# Patient Record
Sex: Female | Born: 1960 | Race: Black or African American | Hispanic: No | Marital: Single | Smoking: Never smoker
Health system: Southern US, Community
[De-identification: ages and names within clinical notes are randomized; demographics above are authoritative.]

## PROBLEM LIST (undated history)

## (undated) DIAGNOSIS — D649 Anemia, unspecified: Secondary | ICD-10-CM

## (undated) DIAGNOSIS — J45909 Unspecified asthma, uncomplicated: Secondary | ICD-10-CM

## (undated) HISTORY — PX: FRACTURE SURGERY: SHX138

## (undated) HISTORY — PX: TUBAL LIGATION: SHX77

---

## 2017-02-14 ENCOUNTER — Encounter (HOSPITAL_COMMUNITY): Payer: Self-pay | Admitting: Emergency Medicine

## 2017-02-14 ENCOUNTER — Ambulatory Visit (HOSPITAL_COMMUNITY)
Admission: EM | Admit: 2017-02-14 | Discharge: 2017-02-14 | Disposition: A | Discharge status: Home / Self Care | Payer: BLUE CROSS/BLUE SHIELD | Attending: Family Medicine | Admitting: Family Medicine

## 2017-02-14 DIAGNOSIS — R200 Anesthesia of skin: Secondary | ICD-10-CM

## 2017-02-14 DIAGNOSIS — R202 Paresthesia of skin: Secondary | ICD-10-CM

## 2017-02-14 MED ORDER — PREDNISONE 20 MG PO TABS: 40.0000 mg | ORAL_TABLET | Freq: Every day | ORAL | 0 refills | Status: AC

## 2017-02-14 MED ORDER — DICLOFENAC SODIUM 1 % TD GEL: 2.0000 g | Freq: Four times a day (QID) | TRANSDERMAL | 0 refills | Status: DC

## 2017-02-14 NOTE — ED Triage Notes (Signed)
PT reports right arm numbness and tingling for 3 weeks. PT is not diabetic. PT reports no injury.

## 2017-02-14 NOTE — ED Provider Notes (Signed)
[facility]    CSN: [id number] Arrival date & time: 02/14/17  1718     History   Chief Complaint Chief Complaint  Patient presents with  . Numbness    HPI Briana Rogers is a 56 y.o. female.   56 year old healthy female comes in for 2 week history of intermittent numbness/tingling of the hand. Symptoms worse at night with some pain at the wrist. Has tried some topical icy/hot with improvement, but wears off in 1-2 hours. Patient's work requires repetitive motion of the wrist, has not noticed the movement causing worsening of symptoms. Denies injury/trauma. Patient denies history of DM.       History reviewed. No pertinent past medical history.  There are no active problems to display for this patient.   Past Surgical History:  Procedure Laterality Date  . FRACTURE SURGERY    . TUBAL LIGATION      OB History    No data available       Home Medications    Prior to Admission medications   Medication Sig Start Date End Date Taking? Authorizing Provider  diclofenac sodium (VOLTAREN) 1 % GEL Apply 2 g topically 4 (four) times daily. 02/14/17   Cathie Hoops, Amy V, PA-C  predniSONE (DELTASONE) 20 MG tablet Take 2 tablets (40 mg total) by mouth daily. 02/14/17 02/19/17  Belinda Fisher, PA-C    Family History No family history on file.  Social History Social History  Substance Use Topics  . Smoking status: Never Smoker  . Smokeless tobacco: Never Used  . Alcohol use Not on file     Allergies   Patient has no known allergies.   Review of Systems Review of Systems  Reason unable to perform ROS: See HPI as above.     Physical Exam Triage Vital Signs ED Triage Vitals [02/14/17 1758]  Enc Vitals Group     BP 135/79     Pulse Rate 60     Resp 16     Temp 98.1 F (36.7 C)     Temp Source Oral     SpO2 100 %     Weight 157 lb (71.2 kg)     Height 5' (1.524 m)     Head Circumference      Peak Flow      Pain Score 0     Pain Loc      Pain Edu?    Excl. in GC?    No data found.   Updated Vital Signs BP 135/79   Pulse 60   Temp 98.1 F (36.7 C) (Oral)   Resp 16   Ht 5' (1.524 m)   Wt 157 lb (71.2 kg)   SpO2 100%   BMI 30.66 kg/m   Physical Exam  Constitutional: She is oriented to person, place, and time. She appears well-developed and well-nourished. No distress.  HENT:  Head: Normocephalic and atraumatic.  Eyes: Pupils are equal, round, and reactive to light. Conjunctivae are normal.  Cardiovascular: Normal rate, regular rhythm and normal heart sounds.  Exam reveals no gallop and no friction rub.   No murmur heard. Pulmonary/Chest: Effort normal and breath sounds normal. She has no wheezes. She has no rales.  Musculoskeletal:  No tenderness on palpation of the elbow/arm/wrist/fingers. Full range of motion of elbow, wrist, fingers. Strength normal and equal bilaterally. Sensation decreased  2nd-4th fingers. Radial pulses 2+ and equal bilaterally. Capillary refill unable to assess due to nail polish.  Negative tinels. Positive phalen  Neurological: She is alert and oriented to person, place, and time.  Skin: Skin is warm and dry.     UC Treatments / Results  Labs (all labs ordered are listed, but only abnormal results are displayed) Labs Reviewed - No data to display  EKG  EKG Interpretation None       Radiology No results found.  Procedures Procedures (including critical care time)  Medications Ordered in UC Medications - No data to display   Initial Impression / Assessment and Plan / UC Course  I have reviewed the triage vital signs and the nursing notes.  Pertinent labs & imaging results that were available during my care of the patient were reviewed by me and considered in my medical decision making (see chart for details).    Possible carpel tunnel causing symptoms. Given patient with stomach upset with oral NSAIDs. Voltaren gel and prednisone as directed. Wrist splint during activity and at  night. Follow up with orthopedics if symptoms not improving. Return precautions given.   Final Clinical Impressions(s) / UC Diagnoses   Final diagnoses:  Numbness and tingling in right hand    New Prescriptions Discharge Medication List as of 02/14/2017  6:38 PM    START taking these medications   Details  diclofenac sodium (VOLTAREN) 1 % GEL Apply 2 g topically 4 (four) times daily., Starting Wed 02/14/2017, Normal    predniSONE (DELTASONE) 20 MG tablet Take 2 tablets (40 mg total) by mouth daily., Starting Wed 02/14/2017, Until Mon 02/19/2017, Normal          Linward HeadlandYu, Amy V, [place]PA-C 02/14/17 2053

## 2017-02-14 NOTE — Discharge Instructions (Signed)
Possible carpel tunnel syndrome given history and exam. You can take tylenol for pain. Voltaren gel for pain and inflammation. Prednisone as directed for inflammation. Ice compress and elevation. Wrist splint during activity and at night. Follow up with orthopedics if symptoms do not improve for further evaluation.

## 2017-12-14 ENCOUNTER — Ambulatory Visit (HOSPITAL_COMMUNITY)
Admission: EM | Admit: 2017-12-14 | Discharge: 2017-12-14 | Disposition: A | Discharge status: Home / Self Care | Payer: BLUE CROSS/BLUE SHIELD | Attending: Family Medicine | Admitting: Family Medicine

## 2017-12-14 ENCOUNTER — Encounter (HOSPITAL_COMMUNITY): Payer: Self-pay

## 2017-12-14 ENCOUNTER — Ambulatory Visit (INDEPENDENT_AMBULATORY_CARE_PROVIDER_SITE_OTHER): Payer: BLUE CROSS/BLUE SHIELD

## 2017-12-14 DIAGNOSIS — J189 Pneumonia, unspecified organism: Secondary | ICD-10-CM | POA: Diagnosis not present

## 2017-12-14 DIAGNOSIS — R0600 Dyspnea, unspecified: Secondary | ICD-10-CM | POA: Diagnosis not present

## 2017-12-14 DIAGNOSIS — R05 Cough: Secondary | ICD-10-CM | POA: Diagnosis not present

## 2017-12-14 MED ORDER — PREDNISONE 10 MG (21) PO TBPK: ORAL_TABLET | ORAL | 0 refills | Status: DC

## 2017-12-14 MED ORDER — IPRATROPIUM-ALBUTEROL 0.5-2.5 (3) MG/3ML IN SOLN
3.0000 mL | Freq: Once | RESPIRATORY_TRACT | Status: AC
  Administered 2017-12-14: 3 mL via RESPIRATORY_TRACT

## 2017-12-14 MED ORDER — AZITHROMYCIN 250 MG PO TABS: ORAL_TABLET | ORAL | 0 refills | Status: DC

## 2017-12-14 MED ORDER — NYSTATIN 100000 UNIT/GM EX CREA: TOPICAL_CREAM | CUTANEOUS | 1 refills | Status: DC

## 2017-12-14 MED ORDER — ALBUTEROL SULFATE HFA 108 (90 BASE) MCG/ACT IN AERS: 1.0000 | INHALATION_SPRAY | Freq: Four times a day (QID) | RESPIRATORY_TRACT | 0 refills | Status: DC | PRN

## 2017-12-14 MED ORDER — IPRATROPIUM-ALBUTEROL 0.5-2.5 (3) MG/3ML IN SOLN
RESPIRATORY_TRACT | Status: AC
  Filled 2017-12-14: qty 3

## 2017-12-14 NOTE — ED Triage Notes (Signed)
Pt presents  with an asthma flare up and a rash under breast

## 2017-12-14 NOTE — ED Provider Notes (Signed)
[facility]    CSN: [id number] Arrival date & time: 12/14/17  [id number]     History   Chief Complaint Chief Complaint  Patient presents with  . Asthma  . Rash    Under Breast     HPI Briana Rogers is a 57 y.o. female.   Patient is a 57 year old female with past medical history of asthma.  She presents with 1 month of cough and congestion.  The symptoms have been waxing and waning.  She has been taking multiple different over-the-counter medications to include Mucinex, TheraFlu.  Her symptoms will get better but then return.  He denies any fever, chills, body aches, fatigue.  She typically uses an albuterol inhaler but has ran out.  She denies any chest pain, shortness of breath.   She is also complaining of rash under her breasts that she contributes to workin gout and sweating. The rash hs been there for months. She has been using diaper cream and the rash has improved. It is itchy and smelly at times.   ROS per HPI      History reviewed. No pertinent past medical history.  There are no active problems to display for this patient.   Past Surgical History:  Procedure Laterality Date  . FRACTURE SURGERY    . TUBAL LIGATION      OB History   None      Home Medications    Prior to Admission medications   Medication Sig Start Date End Date Taking? Authorizing Provider  albuterol (PROVENTIL HFA;VENTOLIN HFA) 108 (90 Base) MCG/ACT inhaler Inhale 1-2 puffs into the lungs every 6 (six) hours as needed for wheezing or shortness of breath. 12/14/17   Dahlia ByesBast, Celestina Gironda A, NP  azithromycin (ZITHROMAX) 250 MG tablet 1 z pac 12/14/17   Rickia Freeburg A, NP  diclofenac sodium (VOLTAREN) 1 % GEL Apply 2 g topically 4 (four) times daily. 02/14/17   Cathie HoopsYu, Amy V, PA-C  predniSONE (STERAPRED UNI-PAK 21 TAB) 10 MG (21) TBPK tablet 6 tabs for 1 day, then 5 tabs for 1 das, then 4 tabs for 1 day, then 3 tabs for 1 day, 2 tabs for 1 day, then 1 tab for 1 day 12/14/17   Janace ArisBast, Riki Berninger A, NP     Family History History reviewed. No pertinent family history.  Social History Social History   Tobacco Use  . Smoking status: Never Smoker  . Smokeless tobacco: Never Used  Substance Use Topics  . Alcohol use: Not on file  . Drug use: No     Allergies   Patient has no known allergies.   Review of Systems Review of Systems   Physical Exam Triage Vital Signs ED Triage Vitals  Enc Vitals Group     BP 12/14/17 0905 (!) 150/87     Pulse Rate 12/14/17 0905 61     Resp 12/14/17 0905 20     Temp 12/14/17 0905 98.1 F (36.7 C)     Temp Source 12/14/17 0905 Oral     SpO2 12/14/17 0905 99 %     Weight --      Height --      Head Circumference --      Peak Flow --      Pain Score 12/14/17 0907 5     Pain Loc --      Pain Edu? --      Excl. in GC? --    No data found.  Updated Vital Signs BP (!) 150/87 (  BP Location: Left Arm)   Pulse 61   Temp 98.1 F (36.7 C) (Oral)   Resp 20   SpO2 99%   Visual Acuity Right Eye Distance:   Left Eye Distance:   Bilateral Distance:    Right Eye Near:   Left Eye Near:    Bilateral Near:     Physical Exam  Constitutional: She is oriented to person, place, and time. She appears well-developed and well-nourished.  Very pleasant.  Nontoxic or ill-appearing  HENT:  Head: Normocephalic and atraumatic.  Right Ear: External ear normal.  Left Ear: External ear normal.  No posterior oropharyngeal erythema, tonsillar swelling, exudates.   Eyes: Conjunctivae are normal.  Neck: Normal range of motion.  No adenopathy  Cardiovascular: Normal rate, regular rhythm and normal heart sounds.  Pulmonary/Chest: She exhibits no tenderness.  Rhonchi throughout all lung fields.  Mild dyspnea.   Abdominal: Soft.  Musculoskeletal: Normal range of motion.  Neurological: She is alert and oriented to person, place, and time.  Skin: Skin is warm and dry.  Fungal rash under bilateral breasts. No discharge. Odor or erythema.   Psychiatric: She  has a normal mood and affect.  Nursing note and vitals reviewed.    UC Treatments / Results  Labs (all labs ordered are listed, but only abnormal results are displayed) Labs Reviewed - No data to display  EKG None  Radiology Dg Chest 2 View  Result Date: 12/14/2017 CLINICAL DATA:  57 year old female with cough for 1 month. EXAM: CHEST - 2 VIEW COMPARISON:  None. FINDINGS: Cardiac size is at the upper limits of normal to mildly increased. Mildly tortuous thoracic aorta. Other mediastinal contours are within normal limits. Visualized tracheal air column is within normal limits. Underlying moderate thoracolumbar scoliosis, with levoconvex curvature at the thoracolumbar junction. No acute osseous abnormality identified. Lung volumes are at the upper limits of normal. No pneumothorax, pulmonary edema, pleural effusion or confluent pulmonary opacity. Mild bilateral increased pulmonary interstitial markings. Visualized tracheal air column is within normal limits. Negative visible bowel gas pattern. IMPRESSION: 1. Borderline to mild cardiomegaly and nonspecific increased pulmonary interstitial markings. Consider viral/atypical respiratory infection, with no pleural effusion to suggest mild interstitial edema. 2. Moderate thoracolumbar scoliosis. Electronically Signed   By: Odessa Fleming M.D.   On: 12/14/2017 09:38    Procedures Procedures (including critical care time)  Medications Ordered in UC Medications  ipratropium-albuterol (DUONEB) 0.5-2.5 (3) MG/3ML nebulizer solution 3 mL (3 mLs Nebulization Given 12/14/17 0938)    Initial Impression / Assessment and Plan / UC Course  I have reviewed the triage vital signs and the nursing notes.  Pertinent labs & imaging results that were available during my care of the patient were reviewed by me and considered in my medical decision making (see chart for details).     Xray revealed   1.Borderline to mild cardiomegaly and nonspecific  increased pulmonary interstitial markings. Consider viral/atypical respiratory infection, with no pleural effusion to suggest mild interstitial edema. 2. Moderate thoracolumbar scoliosis.   Lungs sounds improved after breathing treatment.   Will go ahead and treat for PNA and bronchitis.  based on symptoms, length of illness and physical exam findings.   If she develops any worsening symptoms she needs to go to the ER.  Final Clinical Impressions(s) / UC Diagnoses   Final diagnoses:  Pneumonia due to infectious organism, unspecified laterality, unspecified part of lung     Discharge Instructions     It was nice meeting you!!  We will go ahead and treat you for pneumonia.  Antibiotics and inhaler sent to the pharmacy.  Steroids to help with the lung inflammation.  I would like for you to follow up with a pulmonologist. Contact given.  Follow up as needed for continued or worsening symptoms     ED Prescriptions    Medication Sig Dispense Auth. Provider   azithromycin (ZITHROMAX) 250 MG tablet 1 z pac 6 each Cherrise Occhipinti A, NP   predniSONE (STERAPRED UNI-PAK 21 TAB) 10 MG (21) TBPK tablet 6 tabs for 1 day, then 5 tabs for 1 das, then 4 tabs for 1 day, then 3 tabs for 1 day, 2 tabs for 1 day, then 1 tab for 1 day 21 tablet Criag Wicklund A, NP   albuterol (PROVENTIL HFA;VENTOLIN HFA) 108 (90 Base) MCG/ACT inhaler Inhale 1-2 puffs into the lungs every 6 (six) hours as needed for wheezing or shortness of breath. 1 Inhaler Dahlia Byes A, NP     Controlled Substance Prescriptions [place] Controlled Substance Registry consulted? Not Applicable   Janace Aris, NP 12/14/17 1027

## 2017-12-14 NOTE — Discharge Instructions (Signed)
It was nice meeting you!!  We will go ahead and treat you for pneumonia.  Antibiotics and inhaler sent to the pharmacy.  Steroids to help with the lung inflammation.  I would like for you to follow up with a pulmonologist. Contact given.  Follow up as needed for continued or worsening symptoms

## 2018-01-14 ENCOUNTER — Ambulatory Visit (INDEPENDENT_AMBULATORY_CARE_PROVIDER_SITE_OTHER): Payer: BLUE CROSS/BLUE SHIELD

## 2018-01-14 ENCOUNTER — Encounter: Payer: Self-pay | Admitting: Podiatry

## 2018-01-14 ENCOUNTER — Ambulatory Visit: Payer: BLUE CROSS/BLUE SHIELD | Admitting: Podiatry

## 2018-01-14 VITALS — BP 139/87 | HR 66

## 2018-01-14 DIAGNOSIS — M722 Plantar fascial fibromatosis: Secondary | ICD-10-CM

## 2018-01-14 DIAGNOSIS — L989 Disorder of the skin and subcutaneous tissue, unspecified: Secondary | ICD-10-CM | POA: Diagnosis not present

## 2018-01-15 NOTE — Progress Notes (Signed)
   Subjective: 57 year old female presenting today as a new patient with a chief complaint of painful callus lesions noted to bilateral feet that have been present for the past several months. She has been applying alcohol to the feet for treatment. Wearing shoes increases the pain. Patient is here for further evaluation and treatment.   No past medical history on file.   Objective:  Physical Exam General: Alert and oriented x3 in no acute distress  Dermatology: Hyperkeratotic lesions present on the bilateral feet x 4. Pain on palpation with a central nucleated core noted. Skin is warm, dry and supple bilateral lower extremities. Negative for open lesions or macerations.  Vascular: Palpable pedal pulses bilaterally. No edema or erythema noted. Capillary refill within normal limits.  Neurological: Epicritic and protective threshold grossly intact bilaterally.   Musculoskeletal Exam: Pain on palpation at the keratotic lesions noted. Range of motion within normal limits bilateral. Muscle strength 5/5 in all groups bilateral.  Radiographic Exam:  Normal osseous mineralization. Joint spaces preserved. No fracture/dislocation/boney destruction.     Assessment: 1. Porokeratosis bilateral feet x 4   Plan of Care:  1. Patient evaluated. X-Rays reviewed.  2. Recommended good shoe gear and OTC insoles.  3. Patient stands 11 hours per day, seven days per week for work. Patient's symptoms are mostly job related.  4. Recommended Revitaderm urea 40% lotion.  5. Return to clinic as needed.   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   Felecia ShellingBrent M. Bich Mchaney, DPM [facility]  Dr. Felecia ShellingBrent M. Sissi Padia, DPM    [address]                                        [place], [place] [address]                Office [phone]  Fax [phone]

## 2018-04-11 ENCOUNTER — Ambulatory Visit (HOSPITAL_COMMUNITY)
Admission: EM | Admit: 2018-04-11 | Discharge: 2018-04-11 | Disposition: A | Discharge status: Home / Self Care | Payer: BLUE CROSS/BLUE SHIELD | Attending: Family Medicine | Admitting: Family Medicine

## 2018-04-11 ENCOUNTER — Encounter (HOSPITAL_COMMUNITY): Payer: Self-pay | Admitting: Family Medicine

## 2018-04-11 ENCOUNTER — Other Ambulatory Visit: Payer: Self-pay

## 2018-04-11 DIAGNOSIS — M62838 Other muscle spasm: Secondary | ICD-10-CM | POA: Diagnosis not present

## 2018-04-11 DIAGNOSIS — Z886 Allergy status to analgesic agent status: Secondary | ICD-10-CM | POA: Insufficient documentation

## 2018-04-11 DIAGNOSIS — Z9889 Other specified postprocedural states: Secondary | ICD-10-CM | POA: Insufficient documentation

## 2018-04-11 DIAGNOSIS — Z79899 Other long term (current) drug therapy: Secondary | ICD-10-CM | POA: Diagnosis not present

## 2018-04-11 DIAGNOSIS — Z791 Long term (current) use of non-steroidal anti-inflammatories (NSAID): Secondary | ICD-10-CM | POA: Insufficient documentation

## 2018-04-11 LAB — POCT I-STAT, CHEM 8
BUN: 12 mg/dL (ref 6–20)
Calcium, Ion: 1.2 mmol/L (ref 1.15–1.40)
Chloride: 106 mmol/L (ref 98–111)
Creatinine, Ser: 0.9 mg/dL (ref 0.44–1.00)
GLUCOSE: 101 mg/dL — AB (ref 70–99)
HCT: 40 % (ref 36.0–46.0)
HEMOGLOBIN: 13.6 g/dL (ref 12.0–15.0)
POTASSIUM: 4.4 mmol/L (ref 3.5–5.1)
SODIUM: 140 mmol/L (ref 135–145)
TCO2: 31 mmol/L (ref 22–32)

## 2018-04-11 LAB — VITAMIN B12: VITAMIN B 12: 328 pg/mL (ref 180–914)

## 2018-04-11 NOTE — Discharge Instructions (Signed)
As we spoke about muscle spasms can be related to some sort of vitamin deficiency. We are checking a few labs today to rule this out This also could be related to muscle fatigue and standing for long periods at her job. It is safe to use the mustard if this helps Make sure you are staying hydrated drinking plenty of water I will give you a note to you were able to take breaks at work. Your lab results are pending and we will call with any positive results

## 2018-04-11 NOTE — ED Provider Notes (Signed)
[facility]    CSN: [id number] Arrival date & time: 04/11/18  1030     History   Chief Complaint Chief Complaint  Patient presents with  . Spasms    HPI Briana Rogers is a 57 y.o. female.   Patient is a 57 year old female that is otherwise healthy.  She presents with nighttime muscle spasms that have been pretty consistent over the past 3 weeks.  She has been using mustard and taking potassium supplements and eating bananas to help with symptoms.  This is somewhat helped her symptoms.  She does not have a history of any vitamin or electrolyte deficiencies.  She has been staying hydrated drinking water.  She recently started a new position at work where she stands for approximately 11 hours per shift without any breaks.  She does wear comfortable shoes with insoles.  The spasms are mostly located in the upper legs bilaterally.  She denies any fevers, chills, body aches.  She denies any weakness, numbness or tingling.  ROS per HPI      History reviewed. No pertinent past medical history.  There are no active problems to display for this patient.   Past Surgical History:  Procedure Laterality Date  . FRACTURE SURGERY    . TUBAL LIGATION      OB History   No obstetric history on file.      Home Medications    Prior to Admission medications   Medication Sig Start Date End Date Taking? Authorizing Provider  ferrous sulfate 325 (65 FE) MG tablet Take by mouth.   Yes [provider]  albuterol (PROVENTIL HFA;VENTOLIN HFA) 108 (90 Base) MCG/ACT inhaler Inhale 1-2 puffs into the lungs every 6 (six) hours as needed for wheezing or shortness of breath. Patient not taking: Reported on 01/14/2018 12/14/17   Dahlia ByesBast, Timaya Bojarski A, NP  azithromycin (ZITHROMAX) 250 MG tablet 1 z pac Patient not taking: Reported on 01/14/2018 12/14/17   Dahlia ByesBast, Nikaela Coyne A, NP  azithromycin 250 mg in dextrose 5 % 125 mL  12/14/17   [provider]  diclofenac sodium (VOLTAREN) 1 % GEL  Apply 2 g topically 4 (four) times daily. Patient not taking: Reported on 01/14/2018 02/14/17   Belinda FisherYu, Amy V, PA-C  ibuprofen (ADVIL,MOTRIN) 600 MG tablet Take by mouth. 06/25/16   [provider]  mirabegron ER (MYRBETRIQ) 50 MG TB24 tablet Take by mouth. 02/15/15   [provider]  nystatin cream (MYCOSTATIN) Apply to affected area 2 times daily Patient not taking: Reported on 01/14/2018 12/14/17   Dahlia ByesBast, Mack Thurmon A, NP  predniSONE (STERAPRED UNI-PAK 21 TAB) 10 MG (21) TBPK tablet 6 tabs for 1 day, then 5 tabs for 1 das, then 4 tabs for 1 day, then 3 tabs for 1 day, 2 tabs for 1 day, then 1 tab for 1 day Patient not taking: Reported on 01/14/2018 12/14/17   Janace ArisBast, Kaylah Chiasson A, NP    Family History History reviewed. No pertinent family history.  Social History Social History   Tobacco Use  . Smoking status: Never Smoker  . Smokeless tobacco: Never Used  Substance Use Topics  . Alcohol use: Not on file  . Drug use: No     Allergies   Ibuprofen   Review of Systems Review of Systems   Physical Exam Triage Vital Signs ED Triage Vitals  Enc Vitals Group     BP 04/11/18 1200 (!) 149/87     Pulse Rate 04/11/18 1200 (!) 57     Resp  04/11/18 1200 16     Temp 04/11/18 1200 97.6 F (36.4 C)     Temp Source 04/11/18 1200 Oral     SpO2 04/11/18 1200 99 %     Weight --      Height --      Head Circumference --      Peak Flow --      Pain Score 04/11/18 1204 0     Pain Loc --      Pain Edu? --      Excl. in GC? --    No data found.  Updated Vital Signs BP (!) 149/87 (BP Location: Right Arm)   Pulse (!) 57   Temp 97.6 F (36.4 C) (Oral)   Resp 16   SpO2 99%   Visual Acuity Right Eye Distance:   Left Eye Distance:   Bilateral Distance:    Right Eye Near:   Left Eye Near:    Bilateral Near:     Physical Exam Vitals signs and nursing note reviewed.  Constitutional:      Appearance: Normal appearance. She is not ill-appearing or toxic-appearing.  HENT:      Head: Normocephalic and atraumatic.     Nose: Nose normal.  Eyes:     Conjunctiva/sclera: Conjunctivae normal.  Neck:     Musculoskeletal: Normal range of motion.  Pulmonary:     Effort: Pulmonary effort is normal.  Musculoskeletal: Normal range of motion.        General: No swelling, tenderness, deformity or signs of injury.     Right lower leg: No edema.     Left lower leg: No edema.  Skin:    General: Skin is warm and dry.  Neurological:     Mental Status: She is alert.  Psychiatric:        Mood and Affect: Mood normal.      UC Treatments / Results  Labs (all labs ordered are listed, but only abnormal results are displayed) Labs Reviewed  VITAMIN B12  VITAMIN D 25 HYDROXY (VIT D DEFICIENCY, FRACTURES)  I-STAT CHEM 8, ED    EKG None  Radiology No results found.  Procedures Procedures (including critical care time)  Medications Ordered in UC Medications - No data to display  Initial Impression / Assessment and Plan / UC Course  I have reviewed the triage vital signs and the nursing notes.  Pertinent labs & imaging results that were available during my care of the patient were reviewed by me and considered in my medical decision making (see chart for details).     Most likely muscle spasms at night are due to muscle fatigue. We will do a few labs here to rule out any vitamin or electrolyte deficiencies. Instructed to stay hydrated and eat nutrient rich foods. Continue with the daily vitamin and mustard as needed Work note given as requested to allow patient to take breaks at work to rest legs. Will call with any abnormal findings  Final Clinical Impressions(s) / UC Diagnoses   Final diagnoses:  Night muscle spasms     Discharge Instructions     As we spoke about muscle spasms can be related to some sort of vitamin deficiency. We are checking a few labs today to rule this out This also could be related to muscle fatigue and standing for long periods  at her job. It is safe to use the mustard if this helps Make sure you are staying hydrated drinking plenty of water I will give  you a note to you were able to take breaks at work. Your lab results are pending and we will call with any positive results    ED Prescriptions    None     Controlled Substance Prescriptions [place] Controlled Substance Registry consulted? Not Applicable   Janace Aris, NP 04/11/18 1503

## 2018-04-11 NOTE — ED Triage Notes (Signed)
Pt reports muscle spasms in both lower extremities x3 weeks.  Pt was treated for a knee issue before this and was told to stop walking so much at work.  Pt has been doing a job now where she just stands.  She also reports that one leg is 1/2" shorter than the other.

## 2018-04-12 LAB — VITAMIN D 25 HYDROXY (VIT D DEFICIENCY, FRACTURES): VIT D 25 HYDROXY: 10.2 ng/mL — AB (ref 30.0–100.0)

## 2018-04-15 ENCOUNTER — Telehealth (HOSPITAL_COMMUNITY): Payer: Self-pay | Admitting: Emergency Medicine

## 2018-04-15 NOTE — Telephone Encounter (Signed)
Patient contacted about her Low Vit D, will pick up OTC medicine today and follow up with PCP. All questions answered.

## 2018-11-25 IMAGING — DX DG CHEST 2V
2 series · 2 of 2 positions shown · non-contrast
Comparison: None.

CLINICAL DATA: 56-year-old female with cough for 1 month.

EXAM:
CHEST - 2 VIEW

[chest pa]
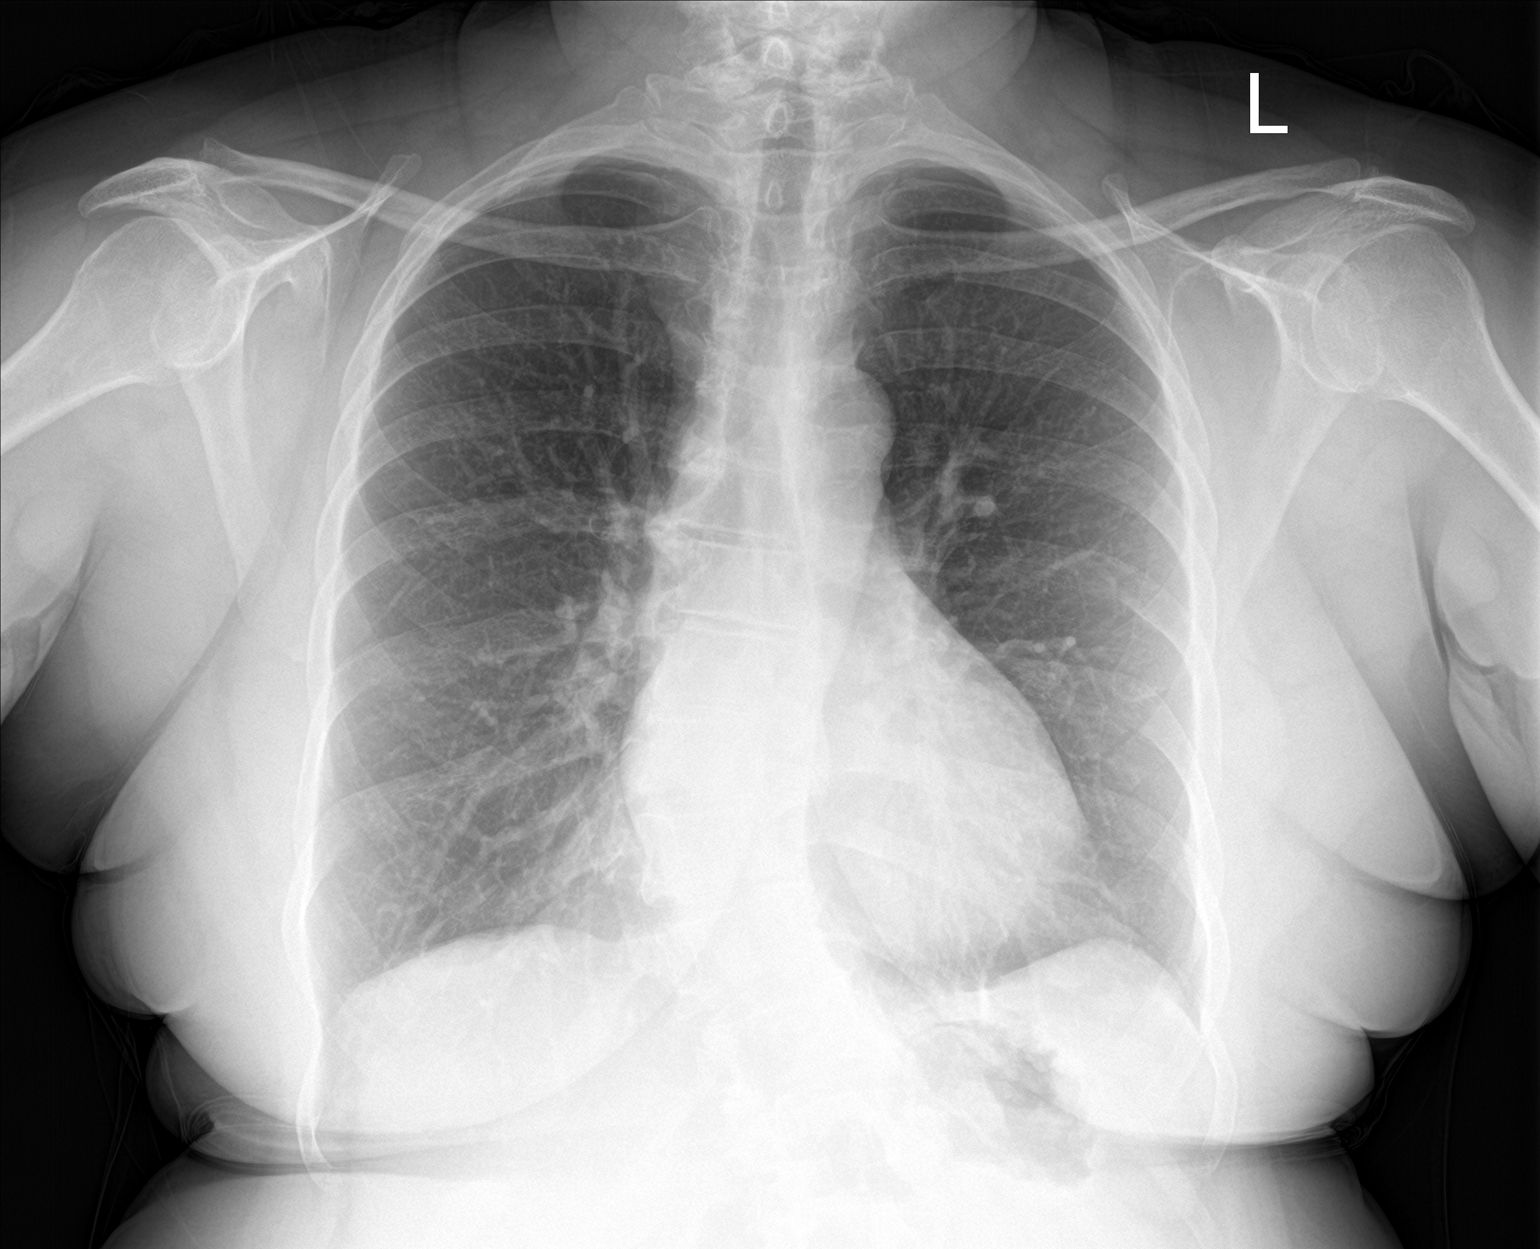

[chest lat]
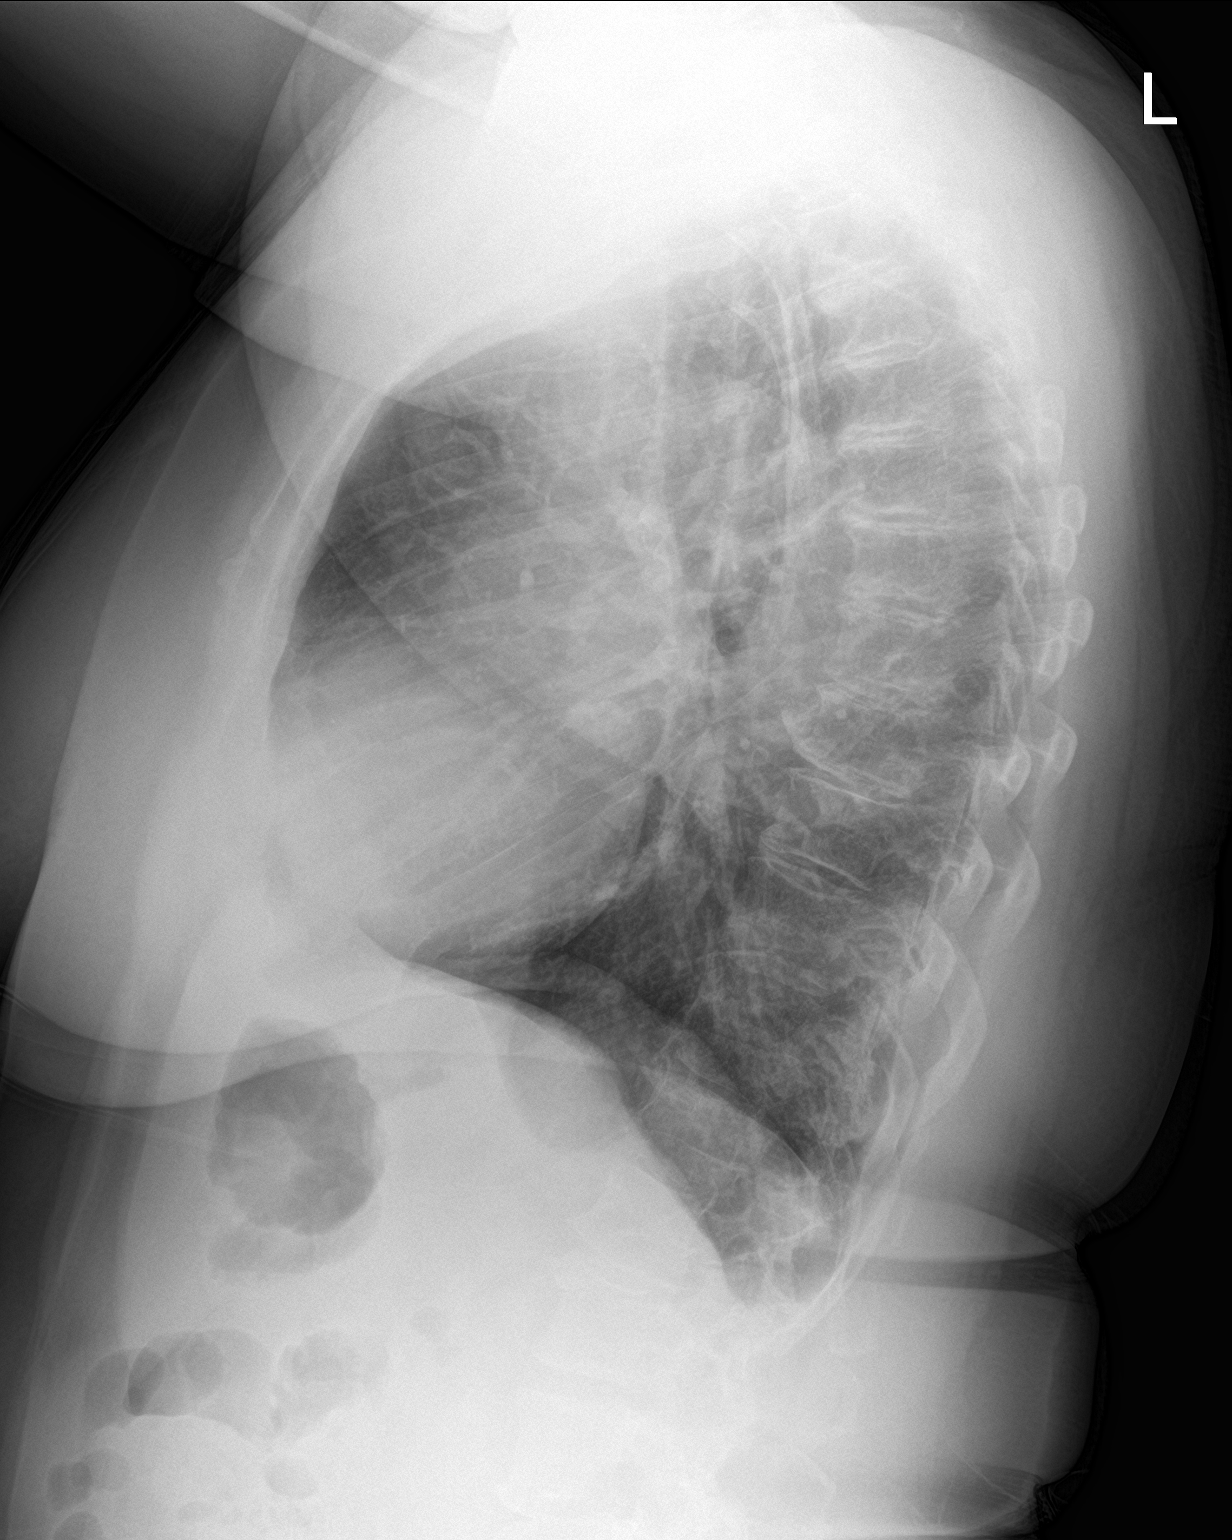

[2 of 2 positions shown; findings below may reference images not displayed]

FINDINGS: Cardiac size is at the upper limits of normal to mildly increased.
Mildly tortuous thoracic aorta. Other mediastinal contours are
within normal limits. Visualized tracheal air column is within
normal limits.

Underlying moderate thoracolumbar scoliosis, with levoconvex
curvature at the thoracolumbar junction. No acute osseous
abnormality identified.

Lung volumes are at the upper limits of normal. No pneumothorax,
pulmonary edema, pleural effusion or confluent pulmonary opacity.
Mild bilateral increased pulmonary interstitial markings. Visualized
tracheal air column is within normal limits.

Negative visible bowel gas pattern.
IMPRESSION: 1. Borderline to mild cardiomegaly and nonspecific increased
pulmonary interstitial markings. Consider viral/atypical respiratory
infection, with no pleural effusion to suggest mild interstitial
edema.
2. Moderate thoracolumbar scoliosis.

## 2020-05-08 ENCOUNTER — Other Ambulatory Visit: Payer: Self-pay

## 2020-05-08 ENCOUNTER — Ambulatory Visit (HOSPITAL_COMMUNITY)
Admission: EM | Admit: 2020-05-08 | Discharge: 2020-05-08 | Disposition: A | Discharge status: Home / Self Care | Payer: BC Managed Care – PPO | Attending: Emergency Medicine | Admitting: Emergency Medicine

## 2020-05-08 DIAGNOSIS — R310 Gross hematuria: Secondary | ICD-10-CM

## 2020-05-08 DIAGNOSIS — J45901 Unspecified asthma with (acute) exacerbation: Secondary | ICD-10-CM | POA: Insufficient documentation

## 2020-05-08 DIAGNOSIS — R319 Hematuria, unspecified: Secondary | ICD-10-CM | POA: Insufficient documentation

## 2020-05-08 DIAGNOSIS — U071 COVID-19: Secondary | ICD-10-CM | POA: Insufficient documentation

## 2020-05-08 HISTORY — DX: Unspecified asthma, uncomplicated: J45.909

## 2020-05-08 LAB — POCT URINALYSIS DIPSTICK, ED / UC
Bilirubin Urine: NEGATIVE
Glucose, UA: NEGATIVE mg/dL
Nitrite: NEGATIVE
Protein, ur: 30 mg/dL — AB
Specific Gravity, Urine: 1.025 (ref 1.005–1.030)
Urobilinogen, UA: 0.2 mg/dL (ref 0.0–1.0)
pH: 6 (ref 5.0–8.0)

## 2020-05-08 LAB — SARS CORONAVIRUS 2 (TAT 6-24 HRS): SARS Coronavirus 2: POSITIVE — AB

## 2020-05-08 MED ORDER — PREDNISONE 10 MG PO TABS: 40.0000 mg | ORAL_TABLET | Freq: Every day | ORAL | 0 refills | Status: DC

## 2020-05-08 MED ORDER — ALBUTEROL SULFATE HFA 108 (90 BASE) MCG/ACT IN AERS: 2.0000 | INHALATION_SPRAY | RESPIRATORY_TRACT | 0 refills | Status: DC | PRN

## 2020-05-08 MED ORDER — PREDNISONE 10 MG PO TABS: 40.0000 mg | ORAL_TABLET | Freq: Every day | ORAL | 0 refills | Status: AC

## 2020-05-08 MED ORDER — AZITHROMYCIN 250 MG PO TABS: 250.0000 mg | ORAL_TABLET | Freq: Every day | ORAL | 0 refills | Status: DC

## 2020-05-08 MED ORDER — ALBUTEROL SULFATE HFA 108 (90 BASE) MCG/ACT IN AERS: 2.0000 | INHALATION_SPRAY | RESPIRATORY_TRACT | 0 refills | Status: AC | PRN

## 2020-05-08 NOTE — Discharge Instructions (Signed)
Use the albuterol inhaler and take the prednisone and Zithromax as directed.    Follow up with your primary care provider as scheduled.

## 2020-05-08 NOTE — ED Provider Notes (Signed)
[facility]    CSN: [id number] Arrival date & time: 05/08/20  1001      History   Chief Complaint Chief Complaint  Patient presents with  . Cough  . Nasal Congestion    HPI Briana Rogers is a 60 y.o. female.   Patient presents with wheezing, cough productive of white phlegm, nasal congestion, rhinorrhea x2 weeks.  She has been using her albuterol inhaler with some relief but the inhaler expired 1 year ago.  She also reports blood in her urine for the past 10 days.  She states all of her symptoms started after receiving a steroid injection in her trochanter bursa.  She denies fever, chills, shortness of breath, abdominal pain, dysuria, vomiting, diarrhea, pelvic pain, or other symptoms.  Her medical history includes asthma.  Patient reports she has an appointment with her PCP on 05/11/2020.  The history is provided by the patient and medical records.    Past Medical History:  Diagnosis Date  . Asthma     There are no problems to display for this patient.   Past Surgical History:  Procedure Laterality Date  . FRACTURE SURGERY    . TUBAL LIGATION      OB History   No obstetric history on file.      Home Medications    Prior to Admission medications   Medication Sig Start Date End Date Taking? Authorizing Provider  albuterol (VENTOLIN HFA) 108 (90 Base) MCG/ACT inhaler Inhale 2 puffs into the lungs every 4 (four) hours as needed for wheezing or shortness of breath. 05/08/20  Yes Mickie Bail, NP  azithromycin (ZITHROMAX) 250 MG tablet Take 1 tablet (250 mg total) by mouth daily. Take first 2 tablets together, then 1 every day until finished. 05/08/20  Yes Mickie Bail, NP  predniSONE (DELTASONE) 10 MG tablet Take 4 tablets (40 mg total) by mouth daily for 5 days. 05/08/20 05/13/20 Yes Mickie Bail, NP  ALBUTEROL IN Inhale into the lungs.  05/08/20 Yes [provider]  ferrous sulfate 325 (65 FE) MG tablet Take by mouth.    [provider]   ibuprofen (ADVIL,MOTRIN) 600 MG tablet Take by mouth. 06/25/16   [provider]  mirabegron ER (MYRBETRIQ) 50 MG TB24 tablet Take by mouth. 02/15/15   [provider]    Family History History reviewed. No pertinent family history.  Social History Social History   Tobacco Use  . Smoking status: Never Smoker  . Smokeless tobacco: Never Used  Substance Use Topics  . Drug use: No     Allergies   Ibuprofen   Review of Systems Review of Systems  Constitutional: Negative for chills and fever.  HENT: Positive for congestion and rhinorrhea. Negative for ear pain and sore throat.   Eyes: Negative for pain and visual disturbance.  Respiratory: Positive for cough and wheezing. Negative for shortness of breath.   Cardiovascular: Negative for chest pain and palpitations.  Gastrointestinal: Negative for abdominal pain and vomiting.  Genitourinary: Positive for hematuria. Negative for dysuria.  Musculoskeletal: Negative for arthralgias and back pain.  Skin: Negative for color change and rash.  Neurological: Negative for seizures and syncope.  All other systems reviewed and are negative.    Physical Exam Triage Vital Signs ED Triage Vitals  Enc Vitals Group     BP      Pulse      Resp      Temp      Temp src  SpO2      Weight      Height      Head Circumference      Peak Flow      Pain Score      Pain Loc      Pain Edu?      Excl. in GC?    No data found.  Updated Vital Signs BP 131/82 (BP Location: Left Arm)   Pulse 74   Temp 97.9 F (36.6 C) (Oral)   Resp 18   SpO2 99%   Visual Acuity Right Eye Distance:   Left Eye Distance:   Bilateral Distance:    Right Eye Near:   Left Eye Near:    Bilateral Near:     Physical Exam Vitals and nursing note reviewed.  Constitutional:      General: She is not in acute distress.    Appearance: She is well-developed and well-nourished. She is not ill-appearing.  HENT:     Head: Normocephalic and  atraumatic.     Right Ear: Tympanic membrane normal.     Left Ear: Tympanic membrane normal.     Nose: Rhinorrhea present.     Mouth/Throat:     Mouth: Mucous membranes are moist.  Eyes:     Conjunctiva/sclera: Conjunctivae normal.  Cardiovascular:     Rate and Rhythm: Normal rate and regular rhythm.     Heart sounds: Normal heart sounds.  Pulmonary:     Effort: Pulmonary effort is normal. No respiratory distress.     Breath sounds: Wheezing and rhonchi present.  Abdominal:     Palpations: Abdomen is soft.     Tenderness: There is no abdominal tenderness. There is no right CVA tenderness, left CVA tenderness, guarding or rebound.  Musculoskeletal:        General: No edema.     Cervical back: Neck supple.  Skin:    General: Skin is warm and dry.  Neurological:     General: No focal deficit present.     Mental Status: She is alert and oriented to person, place, and time.  Psychiatric:        Mood and Affect: Mood and affect and mood normal.        Behavior: Behavior normal.      UC Treatments / Results  Labs (all labs ordered are listed, but only abnormal results are displayed) Labs Reviewed  POCT URINALYSIS DIPSTICK, ED / UC - Abnormal; Notable for the following components:      Result Value   Ketones, ur TRACE (*)    Hgb urine dipstick SMALL (*)    Protein, ur 30 (*)    Leukocytes,Ua SMALL (*)    All other components within normal limits  URINE CULTURE  SARS CORONAVIRUS 2 (TAT 6-24 HRS)    EKG   Radiology No results found.  Procedures Procedures (including critical care time)  Medications Ordered in UC Medications - No data to display  Initial Impression / Assessment and Plan / UC Course  I have reviewed the triage vital signs and the nursing notes.  Pertinent labs & imaging results that were available during my care of the patient were reviewed by me and considered in my medical decision making (see chart for details).   Asthma exacerbation.   Hematuria.  Urine culture pending.  COVID pending.  Treating with albuterol inhaler, prednisone, Zithromax.  Instructed patient to self quarantine until her COVID test result is back.  Instructed her to follow-up as scheduled with her  PCP on 05/11/2020.  She agrees to plan of care.   Final Clinical Impressions(s) / UC Diagnoses   Final diagnoses:  Asthma with acute exacerbation, unspecified asthma severity, unspecified whether persistent  Hematuria, unspecified type     Discharge Instructions     Use the albuterol inhaler and take the prednisone and Zithromax as directed.    Follow up with your primary care provider as scheduled.         ED Prescriptions    Medication Sig Dispense Auth. Provider   albuterol (VENTOLIN HFA) 108 (90 Base) MCG/ACT inhaler Inhale 2 puffs into the lungs every 4 (four) hours as needed for wheezing or shortness of breath. 18 g Mickie Bail, NP   predniSONE (DELTASONE) 10 MG tablet Take 4 tablets (40 mg total) by mouth daily for 5 days. 20 tablet Mickie Bail, NP   azithromycin (ZITHROMAX) 250 MG tablet Take 1 tablet (250 mg total) by mouth daily. Take first 2 tablets together, then 1 every day until finished. 6 tablet Mickie Bail, NP     PDMP not reviewed this encounter.   Mickie Bail, NP 05/08/20 1102

## 2020-05-08 NOTE — ED Triage Notes (Addendum)
Pt presents with cough, wheezing  and nasal congestion x 13 days. Albuterol inhaler gives some relief.   Pt reports having blood in the urine x 10 days after Kenalog injection.

## 2020-05-09 LAB — URINE CULTURE

## 2021-07-26 ENCOUNTER — Other Ambulatory Visit: Payer: Self-pay | Admitting: Endocrinology

## 2021-07-26 DIAGNOSIS — Z1231 Encounter for screening mammogram for malignant neoplasm of breast: Secondary | ICD-10-CM

## 2021-10-28 ENCOUNTER — Ambulatory Visit
Admission: RE | Admit: 2021-10-28 | Discharge: 2021-10-28 | Disposition: A | Discharge status: Home / Self Care | Payer: BC Managed Care – PPO | Source: Ambulatory Visit | Attending: Endocrinology | Admitting: Endocrinology

## 2021-10-28 DIAGNOSIS — Z1231 Encounter for screening mammogram for malignant neoplasm of breast: Secondary | ICD-10-CM

## 2022-05-11 ENCOUNTER — Encounter (HOSPITAL_COMMUNITY): Payer: Self-pay

## 2022-05-11 ENCOUNTER — Ambulatory Visit (HOSPITAL_COMMUNITY)
Admission: EM | Admit: 2022-05-11 | Discharge: 2022-05-11 | Disposition: A | Discharge status: Home / Self Care | Payer: BC Managed Care – PPO | Attending: Nurse Practitioner | Admitting: Nurse Practitioner

## 2022-05-11 ENCOUNTER — Ambulatory Visit (INDEPENDENT_AMBULATORY_CARE_PROVIDER_SITE_OTHER): Payer: BC Managed Care – PPO

## 2022-05-11 DIAGNOSIS — R0602 Shortness of breath: Secondary | ICD-10-CM | POA: Diagnosis not present

## 2022-05-11 DIAGNOSIS — R059 Cough, unspecified: Secondary | ICD-10-CM | POA: Diagnosis not present

## 2022-05-11 MED ORDER — PREDNISONE 20 MG PO TABS: ORAL_TABLET | ORAL | 0 refills | Status: AC

## 2022-05-11 MED ORDER — ALBUTEROL SULFATE HFA 108 (90 BASE) MCG/ACT IN AERS: 1.0000 | INHALATION_SPRAY | RESPIRATORY_TRACT | 0 refills | Status: AC | PRN

## 2022-05-11 NOTE — Discharge Instructions (Addendum)
Overall your x-ray is negative for any acute abnormalities.  We do encourage you to follow up with your primary care provider if your symptoms persist for get worse  You have been prescribed prednisone Dosepak to assist with your shortness of breath. Continue your albuterol inhaler every 4-6 hours as needed We do recommend that you use over the counter Tylenol or Ibuprofen as directed (do not exceed daily limits).

## 2022-05-11 NOTE — ED Provider Notes (Signed)
[facility]    CSN: [id number] Arrival date & time: 05/11/22  1506      History   Chief Complaint Chief Complaint  Patient presents with   asthma   Back Pain    HPI Briana Rogers is a 62 y.o. female.   HPI  She is in today for shortness of breath. She has asthma and has been using her albuterol, Mucinex and Theraflu. She feels like the cough is worse. She has back pain with the cough. She has to brace herself. It feels like something is about to break. She is also having a scratchy throat. She reports hoarseness was her first symptom. She endorses asthma attacks in Nov. Dec and Jan. Denies headache, dizziness, visual changes, chest pain, nausea, vomiting or any edema.   Past Medical History:  Diagnosis Date   Asthma     There are no problems to display for this patient.   Past Surgical History:  Procedure Laterality Date   FRACTURE SURGERY     TUBAL LIGATION      OB History   No obstetric history on file.      Home Medications    Prior to Admission medications   Medication Sig Start Date End Date Taking? Authorizing Provider  albuterol (VENTOLIN HFA) 108 (90 Base) MCG/ACT inhaler Inhale 1-2 puffs into the lungs every 4 (four) hours as needed for wheezing or shortness of breath. 05/11/22  Yes Vevelyn Francois, NP  predniSONE (DELTASONE) 20 MG tablet Take 3 tablets (60 mg total) by mouth daily with breakfast for 3 days, THEN 2 tablets (40 mg total) daily with breakfast for 3 days, THEN 1 tablet (20 mg total) daily with breakfast for 3 days. 05/11/22 05/20/22 Yes Vevelyn Francois, NP  albuterol (VENTOLIN HFA) 108 (90 Base) MCG/ACT inhaler Inhale 2 puffs into the lungs every 4 (four) hours as needed for wheezing or shortness of breath. 05/08/20   Sharion Balloon, NP  azithromycin (ZITHROMAX) 250 MG tablet Take 1 tablet (250 mg total) by mouth daily. Take first 2 tablets together, then 1 every day until finished. 05/08/20   Sharion Balloon, NP  ferrous sulfate 325 (65  FE) MG tablet Take by mouth.    [provider]  ibuprofen (ADVIL,MOTRIN) 600 MG tablet Take by mouth. 06/25/16   [provider]  mirabegron ER (MYRBETRIQ) 50 MG TB24 tablet Take by mouth. 02/15/15   [provider]  ALBUTEROL IN Inhale into the lungs.  05/08/20  [provider]    Family History History reviewed. No pertinent family history.  Social History Social History   Tobacco Use   Smoking status: Never   Smokeless tobacco: Never  Vaping Use   Vaping Use: Never used  Substance Use Topics   Alcohol use: Never   Drug use: Never     Allergies   Ibuprofen   Review of Systems Review of Systems   Physical Exam Triage Vital Signs ED Triage Vitals  Enc Vitals Group     BP 05/11/22 1725 (!) 173/96     Pulse Rate 05/11/22 1725 (!) 106     Resp 05/11/22 1725 18     Temp 05/11/22 1725 99.2 F (37.3 C)     Temp Source 05/11/22 1725 Oral     SpO2 05/11/22 1725 98 %     Weight --      Height --      Head Circumference --      Peak Flow --  Pain Score 05/11/22 1723 10     Pain Loc --      Pain Edu? --      Excl. in GC? --    No data found.  Updated Vital Signs BP (!) 149/91 (BP Location: Left Arm)   Pulse (!) 104   Temp 99.5 F (37.5 C) (Oral)   Resp 16   SpO2 95%   Visual Acuity Right Eye Distance:   Left Eye Distance:   Bilateral Distance:    Right Eye Near:   Left Eye Near:    Bilateral Near:     Physical Exam Constitutional:      General: She is not in acute distress. HENT:     Head: Normocephalic and atraumatic.     Right Ear: Tympanic membrane normal.     Left Ear: Tympanic membrane normal.     Nose: Nose normal.     Mouth/Throat:     Mouth: Mucous membranes are moist.  Cardiovascular:     Rate and Rhythm: Normal rate and regular rhythm.     Pulses: Normal pulses.     Heart sounds: Normal heart sounds.  Pulmonary:     Effort: Pulmonary effort is normal.     Breath sounds: Normal breath sounds. No  stridor. No wheezing, rhonchi or rales.  Chest:     Chest wall: No tenderness.  Musculoskeletal:        General: Normal range of motion.     Cervical back: Normal range of motion.  Skin:    General: Skin is warm and dry.     Capillary Refill: Capillary refill takes less than 2 seconds.  Neurological:     General: No focal deficit present.     Mental Status: She is alert.  Psychiatric:        Mood and Affect: Mood normal.        Behavior: Behavior normal.      UC Treatments / Results  Labs (all labs ordered are listed, but only abnormal results are displayed) Labs Reviewed - No data to display  EKG   Radiology DG Chest 2 View  Result Date: 05/11/2022 CLINICAL DATA:  asthma with persistent painful cough EXAM: CHEST - 2 VIEW COMPARISON:  Chest x-ray 12/14/2017 FINDINGS: The heart and mediastinal contours are unchanged. No focal consolidation. No pulmonary edema. No pleural effusion. No pneumothorax. No acute osseous abnormality. Degenerative changes of the thoracic spine. IMPRESSION: No active cardiopulmonary disease. Electronically Signed   By: Tish Frederickson M.D.   On: 05/11/2022 18:29    Procedures Procedures (including critical care time)  Medications Ordered in UC Medications - No data to display  Initial Impression / Assessment and Plan / UC Course  I have reviewed the triage vital signs and the nursing notes.  Pertinent labs & imaging results that were available during my care of the patient were reviewed by me and considered in my medical decision making (see chart for details).     Shortness of breath Final Clinical Impressions(s) / UC Diagnoses   Final diagnoses:  SOB (shortness of breath)     Discharge Instructions      Overall your x-ray is negative for any acute abnormalities.  We do encourage you to follow up with your primary care provider if your symptoms persist for get worse  You have been prescribed prednisone Dosepak to assist with your  shortness of breath. Continue your albuterol inhaler every 4-6 hours as needed We do recommend that you use over the  counter Tylenol or Ibuprofen as directed (do not exceed daily limits).       ED Prescriptions     Medication Sig Dispense Auth. Provider   predniSONE (DELTASONE) 20 MG tablet Take 3 tablets (60 mg total) by mouth daily with breakfast for 3 days, THEN 2 tablets (40 mg total) daily with breakfast for 3 days, THEN 1 tablet (20 mg total) daily with breakfast for 3 days. 18 tablet Dionisio David M, NP   albuterol (VENTOLIN HFA) 108 (90 Base) MCG/ACT inhaler Inhale 1-2 puffs into the lungs every 4 (four) hours as needed for wheezing or shortness of breath. 8 g Vevelyn Francois, NP      PDMP not reviewed this encounter.   Dionisio David [place], [place] 05/11/22 1904

## 2022-05-11 NOTE — ED Triage Notes (Signed)
Patient reports that she has been having SOB, a non productive cough, upper back pain, nasal congestion x 6 days.  Patient has been taking Mucinex, albuterol inhaler, TheraFlu, Zyrtec with very little relief.

## 2022-06-14 ENCOUNTER — Encounter (HOSPITAL_BASED_OUTPATIENT_CLINIC_OR_DEPARTMENT_OTHER): Payer: Self-pay | Admitting: Orthopedic Surgery

## 2022-06-17 NOTE — H&P (Signed)
Preoperative History & Physical Exam  Surgeon: Matt Holmes, MD  Diagnosis: Right middle trigger finger  Planned Procedure: Procedure(s) (LRB): Right middle trigger finger release (Right)  History of Present Illness:   Patient is a 62 y.o. female with symptoms consistent with Right middle trigger finger who presents for surgical intervention. The risks, benefits and alternatives of surgical intervention were discussed and informed consent was obtained prior to surgery.  Past Medical History:  Past Medical History:  Diagnosis Date   Asthma     Past Surgical History:  Past Surgical History:  Procedure Laterality Date   FRACTURE SURGERY     TUBAL LIGATION      Medications:  Prior to Admission medications   Medication Sig Start Date End Date Taking? Authorizing Provider  albuterol (VENTOLIN HFA) 108 (90 Base) MCG/ACT inhaler Inhale 2 puffs into the lungs every 4 (four) hours as needed for wheezing or shortness of breath. 05/08/20  Yes Sharion Balloon, NP  albuterol (VENTOLIN HFA) 108 (90 Base) MCG/ACT inhaler Inhale 1-2 puffs into the lungs every 4 (four) hours as needed for wheezing or shortness of breath. 05/11/22  Yes Vevelyn Francois, NP  ferrous sulfate 325 (65 FE) MG tablet Take by mouth.   Yes [provider]  potassium chloride (KLOR-CON) 10 MEQ tablet Take 10 mEq by mouth daily.   Yes [provider]  ALBUTEROL IN Inhale into the lungs.  05/08/20  [provider]    Allergies:  Ibuprofen  Review of Systems: Negative except per HPI.  Physical Exam: Alert and oriented, NAD Head and neck: no masses, normal alignment CV: pulse intact Pulm: no increased work of breathing, respirations even and unlabored Abdomen: non-distended Extremities: extremities warm and well perfused  LABS: No results found for this or any previous visit (from the past 2160 hour(s)).   Complete History and Physical exam available in the office notes  [name]

## 2022-06-20 ENCOUNTER — Encounter (HOSPITAL_BASED_OUTPATIENT_CLINIC_OR_DEPARTMENT_OTHER)
Admission: RE | Disposition: A | Discharge status: Home / Self Care | Payer: Self-pay | Source: Ambulatory Visit | Attending: Orthopedic Surgery

## 2022-06-20 ENCOUNTER — Ambulatory Visit (HOSPITAL_BASED_OUTPATIENT_CLINIC_OR_DEPARTMENT_OTHER): Payer: No Typology Code available for payment source | Admitting: Anesthesiology

## 2022-06-20 ENCOUNTER — Ambulatory Visit (HOSPITAL_BASED_OUTPATIENT_CLINIC_OR_DEPARTMENT_OTHER)
Admission: RE | Admit: 2022-06-20 | Discharge: 2022-06-20 | Disposition: A | Discharge status: Home / Self Care | Payer: No Typology Code available for payment source | Source: Ambulatory Visit | Attending: Orthopedic Surgery | Admitting: Orthopedic Surgery

## 2022-06-20 ENCOUNTER — Encounter (HOSPITAL_BASED_OUTPATIENT_CLINIC_OR_DEPARTMENT_OTHER): Payer: Self-pay | Admitting: Orthopedic Surgery

## 2022-06-20 ENCOUNTER — Other Ambulatory Visit: Payer: Self-pay

## 2022-06-20 DIAGNOSIS — M65331 Trigger finger, right middle finger: Secondary | ICD-10-CM | POA: Insufficient documentation

## 2022-06-20 DIAGNOSIS — Z01818 Encounter for other preprocedural examination: Secondary | ICD-10-CM

## 2022-06-20 DIAGNOSIS — J45909 Unspecified asthma, uncomplicated: Secondary | ICD-10-CM | POA: Insufficient documentation

## 2022-06-20 DIAGNOSIS — M6530 Trigger finger, unspecified finger: Secondary | ICD-10-CM

## 2022-06-20 HISTORY — PX: TRIGGER FINGER RELEASE: SHX641

## 2022-06-20 HISTORY — DX: Anemia, unspecified: D64.9

## 2022-06-20 SURGERY — RELEASE, A1 PULLEY, FOR TRIGGER FINGER
Anesthesia: Monitor Anesthesia Care | Site: Hand | Laterality: Right

## 2022-06-20 MED ORDER — MIDAZOLAM HCL 5 MG/5ML IJ SOLN
INTRAMUSCULAR | Status: DC | PRN
  Administered 2022-06-20: 2 mg via INTRAVENOUS

## 2022-06-20 MED ORDER — ACETAMINOPHEN 160 MG/5ML PO SOLN: 325.0000 mg | ORAL | Status: DC | PRN

## 2022-06-20 MED ORDER — AMISULPRIDE (ANTIEMETIC) 5 MG/2ML IV SOLN: 10.0000 mg | Freq: Once | INTRAVENOUS | Status: DC | PRN

## 2022-06-20 MED ORDER — PROPOFOL 500 MG/50ML IV EMUL
INTRAVENOUS | Status: DC | PRN
  Administered 2022-06-20: 50 mg via INTRAVENOUS
  Administered 2022-06-20: 100 ug/kg/min via INTRAVENOUS

## 2022-06-20 MED ORDER — ACETAMINOPHEN 500 MG PO TABS: 1000.0000 mg | ORAL_TABLET | Freq: Once | ORAL | Status: DC

## 2022-06-20 MED ORDER — PROMETHAZINE HCL 25 MG/ML IJ SOLN: 6.2500 mg | INTRAMUSCULAR | Status: DC | PRN

## 2022-06-20 MED ORDER — LIDOCAINE HCL 1 % IJ SOLN
INTRAMUSCULAR | Status: DC | PRN
  Administered 2022-06-20: 6 mL

## 2022-06-20 MED ORDER — BACITRACIN ZINC 500 UNIT/GM EX OINT
TOPICAL_OINTMENT | CUTANEOUS | Status: DC | PRN
  Administered 2022-06-20: 1 via TOPICAL

## 2022-06-20 MED ORDER — ONDANSETRON HCL 4 MG/2ML IJ SOLN
INTRAMUSCULAR | Status: AC
  Filled 2022-06-20: qty 2

## 2022-06-20 MED ORDER — FENTANYL CITRATE (PF) 100 MCG/2ML IJ SOLN: 25.0000 ug | INTRAMUSCULAR | Status: DC | PRN

## 2022-06-20 MED ORDER — LACTATED RINGERS IV SOLN: INTRAVENOUS | Status: DC

## 2022-06-20 MED ORDER — MIDAZOLAM HCL 2 MG/2ML IJ SOLN
INTRAMUSCULAR | Status: AC
  Filled 2022-06-20: qty 2

## 2022-06-20 MED ORDER — HYDROCODONE-ACETAMINOPHEN 5-325 MG PO TABS: 1.0000 | ORAL_TABLET | Freq: Four times a day (QID) | ORAL | 0 refills | Status: AC | PRN

## 2022-06-20 MED ORDER — 0.9 % SODIUM CHLORIDE (POUR BTL) OPTIME
TOPICAL | Status: DC | PRN
  Administered 2022-06-20: 1000 mL

## 2022-06-20 MED ORDER — ACETAMINOPHEN 325 MG PO TABS: 325.0000 mg | ORAL_TABLET | ORAL | Status: DC | PRN

## 2022-06-20 MED ORDER — OXYCODONE HCL 5 MG/5ML PO SOLN: 5.0000 mg | Freq: Once | ORAL | Status: DC | PRN

## 2022-06-20 MED ORDER — PROPOFOL 500 MG/50ML IV EMUL
INTRAVENOUS | Status: AC
  Filled 2022-06-20: qty 50

## 2022-06-20 MED ORDER — ACETAMINOPHEN 10 MG/ML IV SOLN: 1000.0000 mg | Freq: Once | INTRAVENOUS | Status: DC | PRN

## 2022-06-20 MED ORDER — OXYCODONE HCL 5 MG PO TABS: 5.0000 mg | ORAL_TABLET | Freq: Once | ORAL | Status: DC | PRN

## 2022-06-20 SURGICAL SUPPLY — 33 items
BLADE SURG 15 STRL LF DISP TIS (BLADE) ×1 IMPLANT
BLADE SURG 15 STRL SS (BLADE) ×1
BNDG CMPR 9X4 STRL LF SNTH (GAUZE/BANDAGES/DRESSINGS) ×1
BNDG ELASTIC 3X5.8 VLCR STR LF (GAUZE/BANDAGES/DRESSINGS) IMPLANT
BNDG ELASTIC 4X5.8 VLCR STR LF (GAUZE/BANDAGES/DRESSINGS) ×1 IMPLANT
BNDG ESMARK 4X9 LF (GAUZE/BANDAGES/DRESSINGS) ×1 IMPLANT
CORD BIPOLAR FORCEPS 12FT (ELECTRODE) IMPLANT
COVER BACK TABLE 60X90IN (DRAPES) ×1 IMPLANT
CUFF TOURN SGL QUICK 18X4 (TOURNIQUET CUFF) ×1 IMPLANT
DRAPE EXTREMITY T 121X128X90 (DISPOSABLE) ×1 IMPLANT
DRAPE SURG 17X23 STRL (DRAPES) ×1 IMPLANT
DRSG EMULSION OIL 3X3 NADH (GAUZE/BANDAGES/DRESSINGS) ×1 IMPLANT
GAUZE SPONGE 4X4 12PLY STRL (GAUZE/BANDAGES/DRESSINGS) ×1 IMPLANT
GLOVE BIOGEL PI IND STRL 7.5 (GLOVE) ×1 IMPLANT
GOWN STRL REUS W/ TWL LRG LVL3 (GOWN DISPOSABLE) ×1 IMPLANT
GOWN STRL REUS W/TWL LRG LVL3 (GOWN DISPOSABLE) ×1
GOWN STRL REUS W/TWL XL LVL3 (GOWN DISPOSABLE) ×1 IMPLANT
KNIFE CARPAL TUNNEL (BLADE) ×1 IMPLANT
NDL HYPO 22X1.5 SAFETY MO (MISCELLANEOUS) ×1 IMPLANT
NEEDLE HYPO 22X1.5 SAFETY MO (MISCELLANEOUS) ×1 IMPLANT
NEEDLE SAFETY HYPO 22GAX1.5 (MISCELLANEOUS) ×1
NS IRRIG 1000ML POUR BTL (IV SOLUTION) ×1 IMPLANT
PACK BASIN DAY SURGERY FS (CUSTOM PROCEDURE TRAY) ×1 IMPLANT
PAD CAST 4YDX4 CTTN HI CHSV (CAST SUPPLIES) IMPLANT
PADDING CAST ABS COTTON 4X4 ST (CAST SUPPLIES) ×1 IMPLANT
PADDING CAST COTTON 4X4 STRL (CAST SUPPLIES) ×1
SHEET MEDIUM DRAPE 40X70 STRL (DRAPES) ×1 IMPLANT
SUT ETHILON 4 0 PS 2 18 (SUTURE) ×1 IMPLANT
SYR 10ML LL (SYRINGE) ×1 IMPLANT
SYR BULB EAR ULCER 3OZ GRN STR (SYRINGE) ×1 IMPLANT
TOWEL GREEN STERILE FF (TOWEL DISPOSABLE) ×2 IMPLANT
TRAY DSU PREP LF (CUSTOM PROCEDURE TRAY) ×1 IMPLANT
UNDERPAD 30X36 HEAVY ABSORB (UNDERPADS AND DIAPERS) ×1 IMPLANT

## 2022-06-20 NOTE — Discharge Instructions (Addendum)
Orthopaedic Hand Surgery Discharge Instructions  WEIGHT BEARING STATUS: Non weight bearing on operative extremity  INCISION CARE: Keep dressing over your incision clean and dry until 5 days after surgery. You may shower by placing a waterproof covering over your dressing. Once dressing is removed, you may allow water to run over the incision and then place Band-Aids over incision. Do not scrub your incision or apply creams/lotions. Do not submerge your incision or swim for 3 weeks after surgery. Contact your surgeon or primary care doctor if you develop redness or drainage from your incision.   PAIN CONTROL: First line medications for post operative pain control are Tylenol (acetaminophen) and Motrin (ibuprofen) if you are able to take these medications. If you have been prescribed a medication these can be taken as breakthrough pain medications. Please note that some narcotic pain medication has acetaminophen added and you should never consume more than 4,'000mg'$  of acetaminophen in 24-hour period. Please note that if you are given Toradol (ketorolac) you should not take similar medications such as ibuprofen or naproxen.  DISCHARGE MEDICATIONS: If you have been prescribed medication it was sent electronically to your pharmacy. No changes have been made to your home medications.  ICE/ELEVATION: Ice and elevate your injured extremity as needed. Avoid direct contact of ice with skin.   BANDAGE FEELS TOO TIGHT: If your bandage feels too tight, first make sure you are elevating your fingers as much as possible. The outer layer of the bandage can be unwrapped and reapplied more loosely. If no improvement, you may carefully cut the inner layer longitudinally until the pressure has resolved and then rewrap the outer layer. If you are not comfortable with these instructions, please call the office and the bandage can be changed for you.   FOLLOW UP: You will be called after surgery with an appointment date and  time, however if you have not received a phone call within 3 days, please call during regular office hours at [phone] to schedule a post operative appointment.  Please Seek Medical Attention if: Call MD for: pain or pressure in chest, jaw, arm, back, neck  Call MD for: temperature greater than 101 F for more than 24 hrs Call MD for: difficulty breathing Call MD for: incision redness, bleeding, drainage  Call MD for: palpitations or feeling that the heart is racing  Call MD for: increased swelling in arm, leg, ankle, or abdomen  Call MD for: lightheadedness, dizziness, fainting Call 911 or go to ER for any medical emergency if you are not able to get in touch with your doctor   J. Sable Feil, MD Orthopaedic Hand Surgeon [facility] Office number: [phone] [address] [place], [place] [address]   Post Anesthesia Home Care Instructions  Activity: Get plenty of rest for the remainder of the day. A responsible individual must stay with you for 24 hours following the procedure.  For the next 24 hours, DO NOT: -Drive a car -Paediatric nurse -Drink alcoholic beverages -Take any medication unless instructed by your physician -Make any legal decisions or sign important papers.  Meals: Start with liquid foods such as gelatin or soup. Progress to regular foods as tolerated. Avoid greasy, spicy, heavy foods. If nausea and/or vomiting occur, drink only clear liquids until the nausea and/or vomiting subsides. Call your physician if vomiting continues.  Special Instructions/Symptoms: Your throat may feel dry or sore from the anesthesia or the breathing tube placed in your throat during surgery. If this causes discomfort, gargle with warm  salt water. The discomfort should disappear within 24 hours.

## 2022-06-20 NOTE — Op Note (Signed)
OPERATIVE NOTE  DATE OF SURGERY: 06/20/22   SURGEON:  Matt Holmes, MD  PREOPERATIVE DIAGNOSIS: Right Middle Trigger Digit   POSTOPERATIVE DIAGNOSIS: Same  NAME OF PROCEDURE:   Right Middle Finger Release of the A1 Pulley   SKIN PREPARATION: Hibiclens  ESTIMATED BLOOD LOSS: Minimal  DESCRIPTION OF PROCEDURE: The patient was met in the pre-operative area and their identity was verified. The operative location and laterality was also verified and marked. The patient was brought to the procedure room and was placed supine on the table. After repeat patient identification, local anesthesia was provided and the patient was prepped and draped in the usual sterile fashion. A final timeout was performed verifying the correction patient, procedure, location and laterality.  The right upper extremity was elevated and exsanguinated with an ace wrap and tourniquet inflated to 228mHg. A longitudinal incision was made over the A1 pulley of the right middle finger. The skin and subcutaneous tissues were divided and neurovacular bundles were protected. The A1 pulley was identified and divided under direct vision. There was complete release of the A1 pulley. The wound was irrigated with normal saline. The skin was closed with 4-0 non-absorbable suture in horizontal mattress fashion. A sterile soft bandage was applied. The tourniquet was deflated and the fingers were all pink, warm and well perfused at the end of the case. All counts were correct. The patient was brought to PACU for recovery in stable condition   JMatt Holmes MD

## 2022-06-20 NOTE — Transfer of Care (Signed)
Immediate Anesthesia Transfer of Care Note  Patient: VENERA GUIFARRO  Procedure(s) Performed: Right middle trigger finger release (Right: Hand)  Patient Location: PACU  Anesthesia Type:MAC  Level of Consciousness: awake, alert , and oriented  Airway & Oxygen Therapy: Patient Spontanous Breathing  Post-op Assessment: Report given to RN and Post -op Vital signs reviewed and stable  Post vital signs: Reviewed and stable  Last Vitals:  Vitals Value Taken Time  BP    Temp 36.3 C 06/20/22 1010  Pulse 74 06/20/22 1010  Resp 18 06/20/22 1010  SpO2 96 % 06/20/22 1010  Vitals shown include unvalidated device data.  Last Pain:  Vitals:   06/20/22 0828  TempSrc: Oral  PainSc: 3       Patients Stated Pain Goal: 3 ([id number])  Complications: No notable events documented.

## 2022-06-20 NOTE — Interval H&P Note (Signed)
History and Physical Interval Note:  06/20/2022 9:26 AM  Briana Rogers  has presented today for surgery, with the diagnosis of Right middle trigger finger.  The various methods of treatment have been discussed with the patient and family. After consideration of risks, benefits and other options for treatment, the patient has consented to  Procedure(s): Right middle trigger finger release (Right) as a surgical intervention.  The patient's history has been reviewed, patient examined, no change in status, stable for surgery.  I have reviewed the patient's chart and labs.  Questions were answered to the patient's satisfaction.     [name]

## 2022-06-20 NOTE — Anesthesia Preprocedure Evaluation (Addendum)
Anesthesia Evaluation  Patient identified by MRN, date of birth, ID band Patient awake    Reviewed: Allergy & Precautions, NPO status , Patient's Chart, lab work & pertinent test results  Airway Mallampati: I  TM Distance: >3 FB Neck ROM: Full    Dental  (+) Teeth Intact, Dental Advisory Given   Pulmonary asthma    breath sounds clear to auscultation       Cardiovascular negative cardio ROS  Rhythm:Regular Rate:Normal     Neuro/Psych negative neurological ROS  negative psych ROS   GI/Hepatic negative GI ROS, Neg liver ROS,,,  Endo/Other  negative endocrine ROS    Renal/GU negative Renal ROS     Musculoskeletal negative musculoskeletal ROS (+)    Abdominal   Peds  Hematology   Anesthesia Other Findings   Reproductive/Obstetrics                             Anesthesia Physical Anesthesia Plan  ASA: 2  Anesthesia Plan: MAC   Post-op Pain Management: Tylenol PO (pre-op)*   Induction: Intravenous  PONV Risk Score and Plan: 3 and Ondansetron, Propofol infusion and Midazolam  Airway Management Planned: Natural Airway and Simple Face Mask  Additional Equipment: None  Intra-op Plan:   Post-operative Plan:   Informed Consent: I have reviewed the patients History and Physical, chart, labs and discussed the procedure including the risks, benefits and alternatives for the proposed anesthesia with the patient or authorized representative who has indicated his/her understanding and acceptance.       Plan Discussed with: CRNA  Anesthesia Plan Comments:        Anesthesia Quick Evaluation

## 2022-06-20 NOTE — Anesthesia Postprocedure Evaluation (Signed)
Anesthesia Post Note  Patient: Briana Rogers  Procedure(s) Performed: Right middle trigger finger release (Right: Hand)     Patient location during evaluation: PACU Anesthesia Type: MAC Level of consciousness: awake and alert Pain management: pain level controlled Vital Signs Assessment: post-procedure vital signs reviewed and stable Respiratory status: spontaneous breathing, nonlabored ventilation, respiratory function stable and patient connected to nasal cannula oxygen Cardiovascular status: stable and blood pressure returned to baseline Postop Assessment: no apparent nausea or vomiting Anesthetic complications: no  No notable events documented.  Last Vitals:  Vitals:   06/20/22 1030 06/20/22 1050  BP: 123/81 (!) 131/90  Pulse: 70 77  Resp: 20 16  Temp:  (!) 36.3 C  SpO2: 97% 95%    Last Pain:  Vitals:   06/20/22 1050  TempSrc:   PainSc: 0-No pain                 Effie Berkshire

## 2022-06-21 ENCOUNTER — Encounter (HOSPITAL_BASED_OUTPATIENT_CLINIC_OR_DEPARTMENT_OTHER): Payer: Self-pay | Admitting: Orthopedic Surgery

## 2023-02-19 ENCOUNTER — Other Ambulatory Visit: Payer: Self-pay | Admitting: Endocrinology

## 2023-02-19 ENCOUNTER — Ambulatory Visit
Admission: RE | Admit: 2023-02-19 | Discharge: 2023-02-19 | Disposition: A | Discharge status: Home / Self Care | Payer: BC Managed Care – PPO | Source: Ambulatory Visit | Attending: Endocrinology | Admitting: Endocrinology

## 2023-02-19 DIAGNOSIS — Z1231 Encounter for screening mammogram for malignant neoplasm of breast: Secondary | ICD-10-CM
